# Patient Record
Sex: Female | Born: 1935 | Race: White | Hispanic: No
Health system: Southern US, Community
[De-identification: ages and names within clinical notes are randomized; demographics above are authoritative.]

---

## 2004-06-03 ENCOUNTER — Ambulatory Visit: Payer: Self-pay | Admitting: Internal Medicine

## 2005-05-15 ENCOUNTER — Ambulatory Visit: Payer: Self-pay | Admitting: Urology

## 2005-09-08 ENCOUNTER — Ambulatory Visit: Payer: Self-pay | Admitting: Internal Medicine

## 2006-02-23 ENCOUNTER — Ambulatory Visit: Payer: Self-pay | Admitting: Internal Medicine

## 2006-09-15 ENCOUNTER — Ambulatory Visit: Payer: Self-pay | Admitting: Internal Medicine

## 2006-10-07 ENCOUNTER — Ambulatory Visit: Payer: Self-pay | Admitting: Internal Medicine

## 2006-11-02 ENCOUNTER — Ambulatory Visit: Payer: Self-pay | Admitting: Internal Medicine

## 2006-11-07 ENCOUNTER — Ambulatory Visit: Payer: Self-pay | Admitting: Internal Medicine

## 2006-12-07 ENCOUNTER — Ambulatory Visit: Payer: Self-pay | Admitting: Internal Medicine

## 2007-01-07 ENCOUNTER — Ambulatory Visit: Payer: Self-pay | Admitting: Internal Medicine

## 2007-02-07 ENCOUNTER — Ambulatory Visit: Payer: Self-pay | Admitting: Internal Medicine

## 2007-02-08 ENCOUNTER — Ambulatory Visit: Payer: Self-pay | Admitting: Internal Medicine

## 2007-03-09 ENCOUNTER — Ambulatory Visit: Payer: Self-pay | Admitting: Internal Medicine

## 2007-08-16 ENCOUNTER — Ambulatory Visit: Payer: Self-pay | Admitting: Internal Medicine

## 2007-09-20 ENCOUNTER — Ambulatory Visit: Payer: Self-pay | Admitting: Internal Medicine

## 2007-10-20 ENCOUNTER — Emergency Department: Payer: Self-pay | Admitting: Emergency Medicine

## 2008-06-08 ENCOUNTER — Ambulatory Visit: Payer: Self-pay | Admitting: Oncology

## 2008-06-22 ENCOUNTER — Ambulatory Visit: Payer: Self-pay | Admitting: Internal Medicine

## 2008-07-05 ENCOUNTER — Ambulatory Visit: Payer: Self-pay | Admitting: Oncology

## 2008-07-09 ENCOUNTER — Ambulatory Visit: Payer: Self-pay | Admitting: Oncology

## 2008-07-12 ENCOUNTER — Ambulatory Visit: Payer: Self-pay | Admitting: Oncology

## 2009-02-22 ENCOUNTER — Ambulatory Visit: Payer: Self-pay | Admitting: Internal Medicine

## 2009-11-05 ENCOUNTER — Ambulatory Visit: Payer: Self-pay | Admitting: Internal Medicine

## 2009-11-06 ENCOUNTER — Ambulatory Visit: Payer: Self-pay | Admitting: Oncology

## 2009-11-08 IMAGING — US ABDOMEN ULTRASOUND
1 series · 14 of 25 positions shown · non-contrast
Comparison: none

REASON FOR EXAM: Follow-up splenomegaly, compare to previous abdominal
U/S
COMMENTS:

[Series 1: abdomen ultrasound · 0.24mm/px · 14 of 46 slices shown]
[im 1/46]
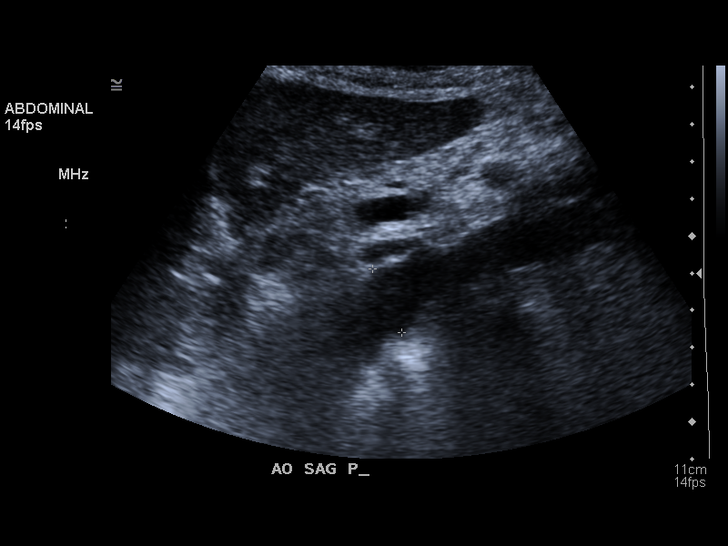
[im 4/46]
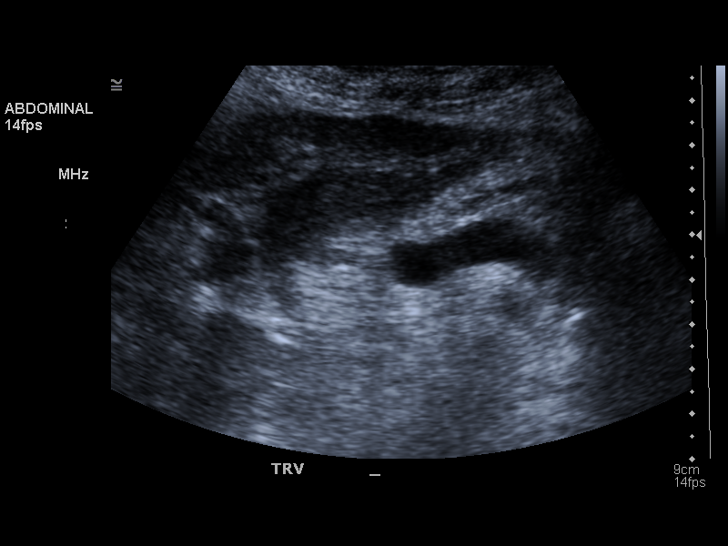
[im 8/46]
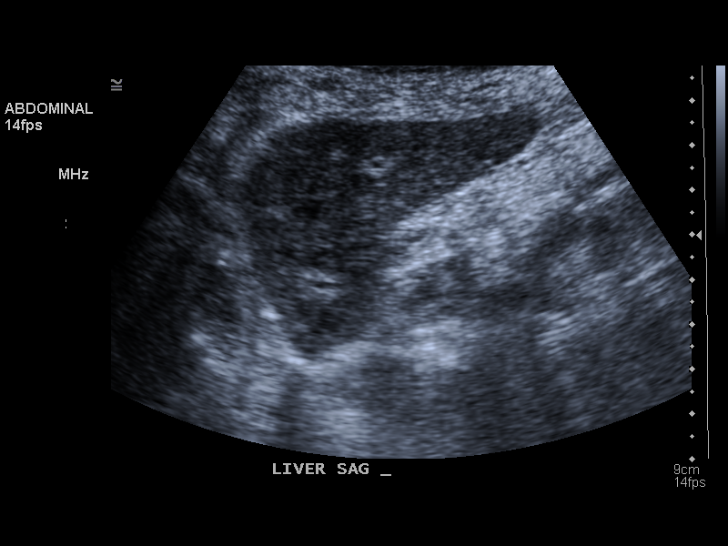
[im 12/46]
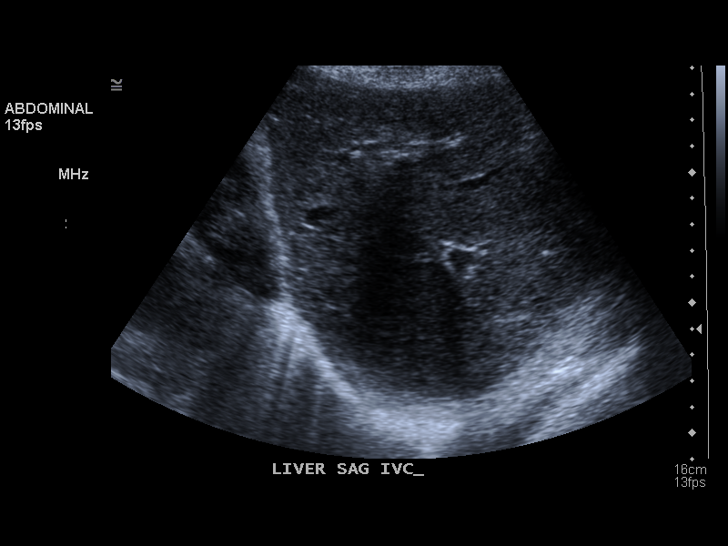
[im 16/46]
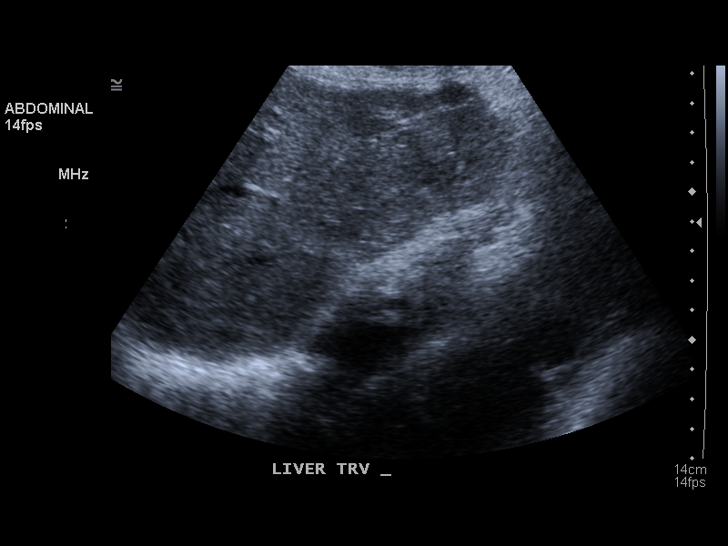
[im 17/46]
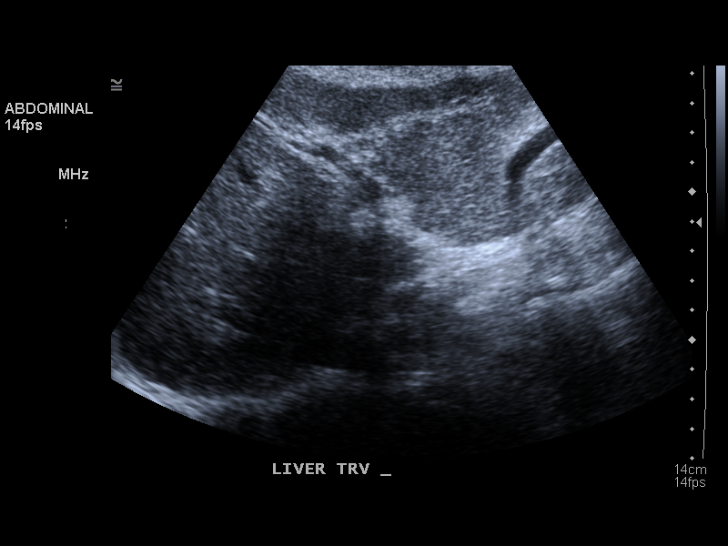
[im 21/46]
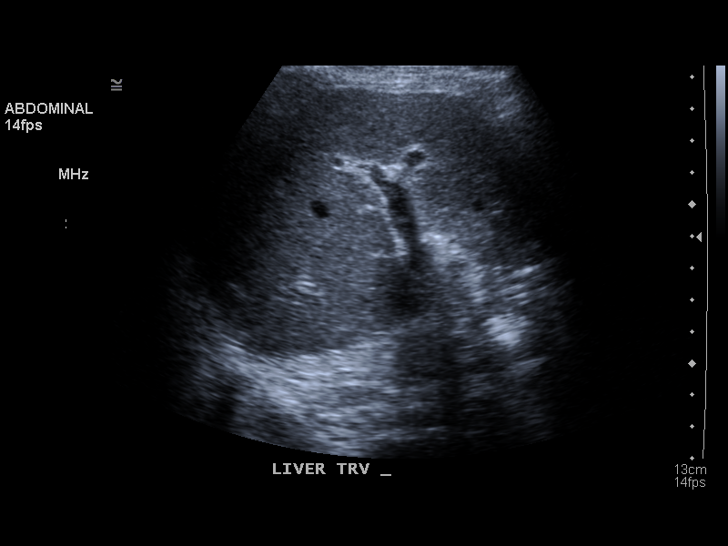
[im 25/46]
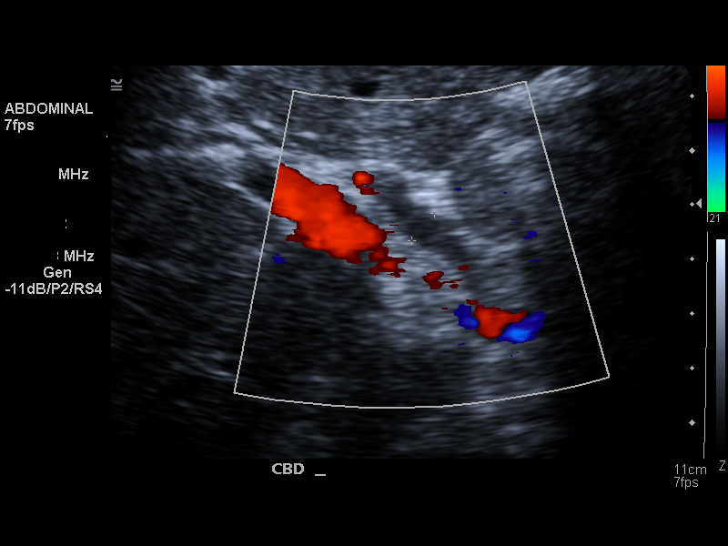
[im 29/46]
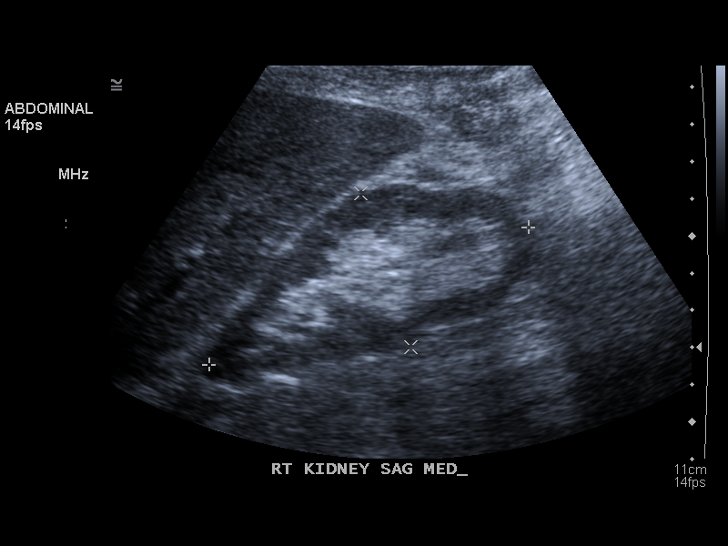
[im 31/46]
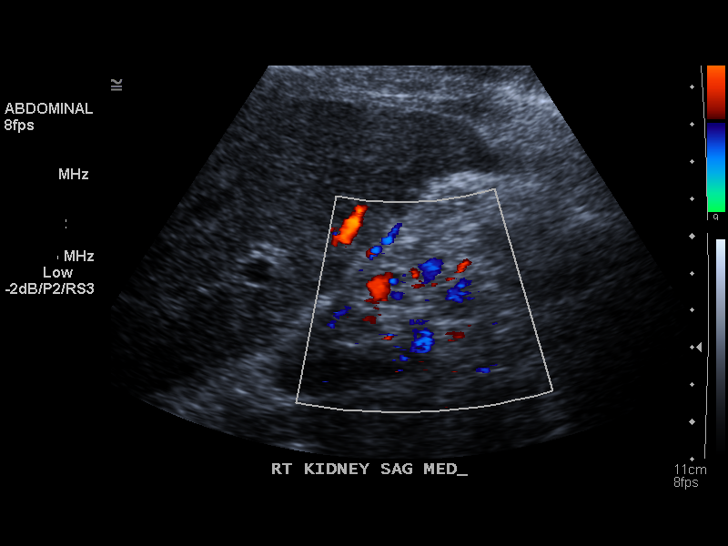
[im 34/46]
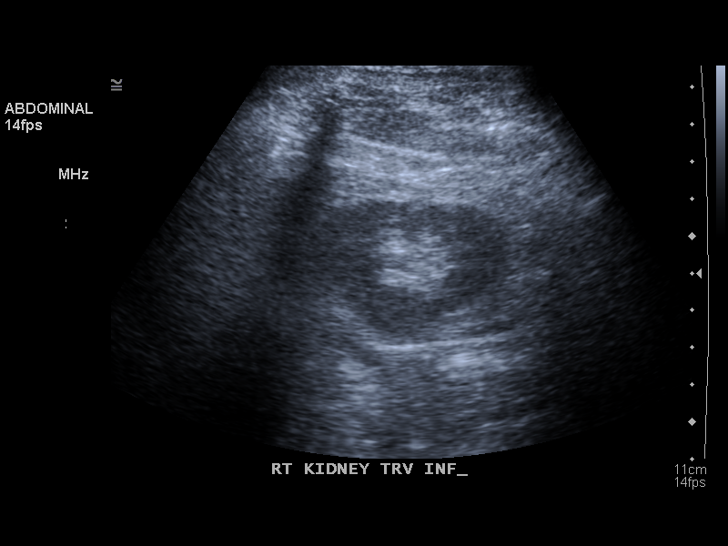
[im 38/46]
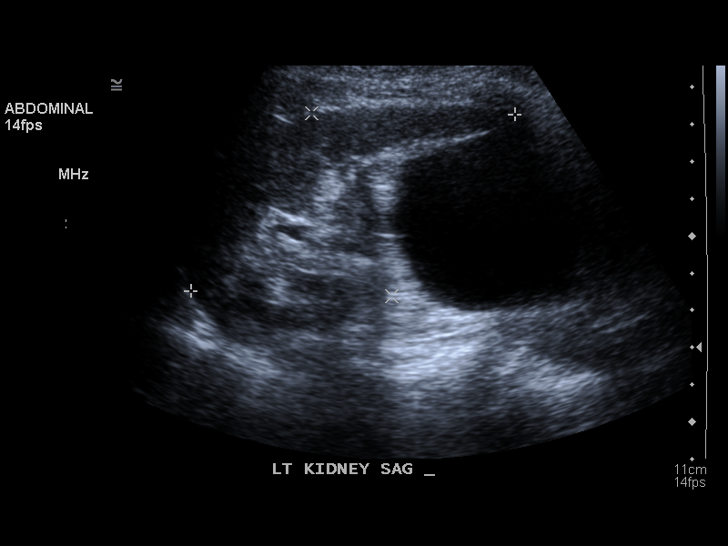
[im 42/46]
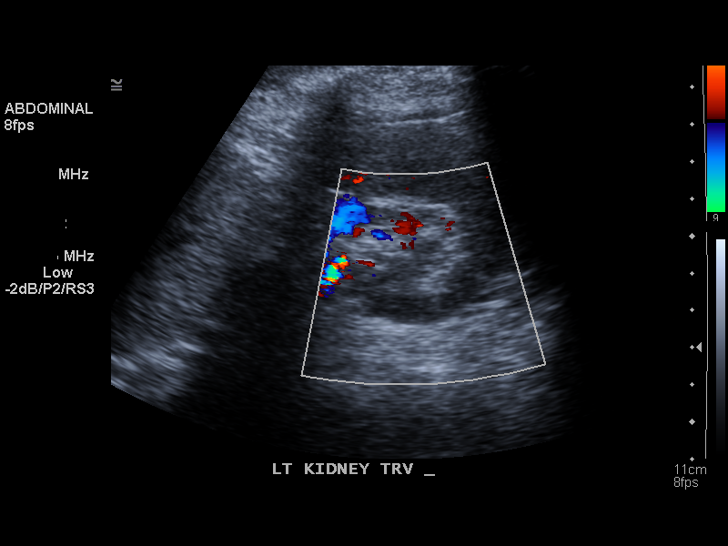
[im 46/46]
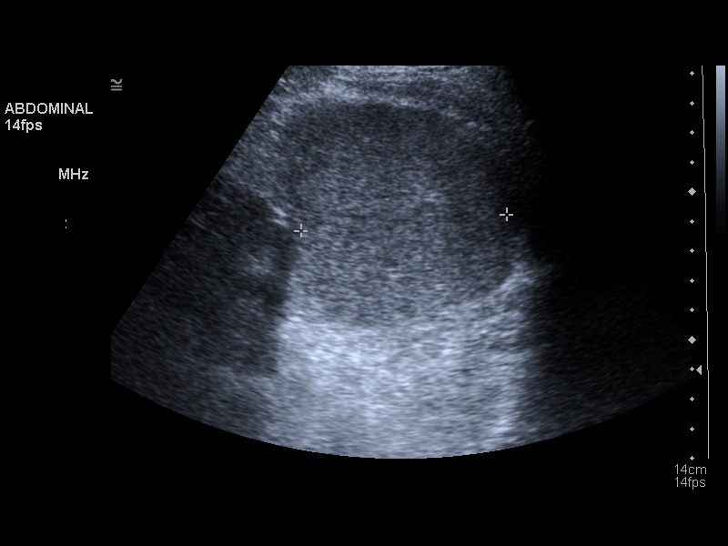

[14 of 25 positions shown; findings below may reference images not displayed]

PROCEDURE:     US  - US ABDOMEN GENERAL SURVEY  - August 16, 2007  [DATE]

RESULT:     The liver, abdominal aorta and inferior vena cava show no
significant abnormalities. The pancreas is not visualized adequately for
evaluation. The spleen is mildly enlarged but appears stable as compared to
the prior exam of 11/24/2006. The patient is status post cholecystectomy.
The common bile duct measures 6.5 mm in diameter which is within the limits
of normal for the post cholecystectomy patient. The kidneys show no
hydronephrosis. There is a 6.4 cm cyst of the inferior aspect of the LEFT
kidney. No ascites is seen.
IMPRESSION: 1.  Stable splenomegaly.
2.  The patient is status post cholecystectomy.
3.  A 6.4 cm cyst of the LEFT kidney is again noted.

## 2009-12-06 ENCOUNTER — Ambulatory Visit: Payer: Self-pay | Admitting: Oncology

## 2010-03-27 ENCOUNTER — Ambulatory Visit: Payer: Self-pay | Admitting: Internal Medicine

## 2010-12-12 ENCOUNTER — Other Ambulatory Visit: Payer: Self-pay | Admitting: Gastroenterology

## 2010-12-29 ENCOUNTER — Ambulatory Visit: Payer: Self-pay | Admitting: Gastroenterology

## 2011-01-04 ENCOUNTER — Emergency Department: Payer: Self-pay | Admitting: Internal Medicine

## 2011-01-06 ENCOUNTER — Ambulatory Visit: Payer: Self-pay | Admitting: Oncology

## 2011-01-07 ENCOUNTER — Ambulatory Visit: Payer: Self-pay | Admitting: Oncology

## 2011-02-07 ENCOUNTER — Ambulatory Visit: Payer: Self-pay | Admitting: Oncology

## 2011-03-09 ENCOUNTER — Ambulatory Visit: Payer: Self-pay | Admitting: Oncology

## 2011-04-09 ENCOUNTER — Ambulatory Visit: Payer: Self-pay | Admitting: Oncology

## 2011-05-09 ENCOUNTER — Ambulatory Visit: Payer: Self-pay | Admitting: Oncology

## 2011-06-09 ENCOUNTER — Ambulatory Visit: Payer: Self-pay | Admitting: Oncology

## 2011-06-25 LAB — CBC CANCER CENTER
Basophil %: 0.2 %
Eosinophil #: 0.1 x10 3/mm (ref 0.0–0.7)
Lymphocyte #: 0.8 x10 3/mm — ABNORMAL LOW (ref 1.0–3.6)
Lymphocyte %: 12.8 %
MCH: 28.8 pg (ref 26.0–34.0)
MCHC: 33.9 g/dL (ref 32.0–36.0)
MCV: 85 fL (ref 80–100)
Monocyte %: 6.8 %
Neutrophil #: 4.6 x10 3/mm (ref 1.4–6.5)
RBC: 4.56 10*6/uL (ref 3.80–5.20)
RDW: 16.2 % — ABNORMAL HIGH (ref 11.5–14.5)

## 2011-06-25 LAB — LACTATE DEHYDROGENASE: LDH: 209 U/L (ref 84–246)

## 2011-07-10 ENCOUNTER — Ambulatory Visit: Payer: Self-pay | Admitting: Oncology

## 2011-08-07 ENCOUNTER — Ambulatory Visit: Payer: Self-pay | Admitting: Oncology

## 2011-08-26 LAB — COMPREHENSIVE METABOLIC PANEL
Albumin: 4 g/dL (ref 3.4–5.0)
Alkaline Phosphatase: 99 U/L (ref 50–136)
Calcium, Total: 8.7 mg/dL (ref 8.5–10.1)
EGFR (African American): >60
EGFR (Non-African Amer.): >60
Glucose: 101 mg/dL — ABNORMAL HIGH (ref 65–99)
Potassium: 3.8 mmol/L (ref 3.5–5.1)
SGOT(AST): 26 U/L (ref 15–37)
SGPT (ALT): 23 U/L
Sodium: 141 mmol/L (ref 136–145)
Total Protein: 6.4 g/dL (ref 6.4–8.2)

## 2011-08-26 LAB — URINALYSIS, COMPLETE
Bilirubin,UR: NEGATIVE
Glucose,UR: NEGATIVE mg/dL (ref 0–75)
Leukocyte Esterase: NEGATIVE
Ph: 7 (ref 4.5–8.0)
RBC,UR: 15 /HPF (ref 0–5)
Specific Gravity: 1.009 (ref 1.003–1.030)
Squamous Epithelial: <1
WBC UR: <1 /HPF (ref 0–5)

## 2011-08-26 LAB — CBC
HCT: 40.1 % (ref 35.0–47.0)
MCV: 86 fL (ref 80–100)
Platelet: 113 10*3/uL — ABNORMAL LOW (ref 150–440)
RDW: 16.2 % — ABNORMAL HIGH (ref 11.5–14.5)
WBC: 8.7 10*3/uL (ref 3.6–11.0)

## 2011-08-27 ENCOUNTER — Observation Stay: Payer: Self-pay | Admitting: Surgery

## 2011-08-27 LAB — BASIC METABOLIC PANEL
Anion Gap: 7 (ref 7–16)
BUN: 6 mg/dL — ABNORMAL LOW (ref 7–18)
Co2: 28 mmol/L (ref 21–32)
Creatinine: 0.52 mg/dL — ABNORMAL LOW (ref 0.60–1.30)
EGFR (African American): >60
EGFR (Non-African Amer.): >60
Osmolality: 260 (ref 275–301)
Sodium: 129 mmol/L — ABNORMAL LOW (ref 136–145)

## 2011-08-27 LAB — CBC WITH DIFFERENTIAL/PLATELET
Basophil %: 0.5 %
Eosinophil %: 1.7 %
HCT: 39.7 % (ref 35.0–47.0)
Lymphocyte #: 0.7 10*3/uL — ABNORMAL LOW (ref 1.0–3.6)
MCHC: 33 g/dL (ref 32.0–36.0)
MCV: 84 fL (ref 80–100)
Monocyte #: 0.5 10*3/uL (ref 0.0–0.7)
Monocyte %: 4.9 %
Neutrophil #: 8.2 10*3/uL — ABNORMAL HIGH (ref 1.4–6.5)
RBC: 4.71 10*6/uL (ref 3.80–5.20)
WBC: 9.6 10*3/uL (ref 3.6–11.0)

## 2011-08-28 LAB — CBC WITH DIFFERENTIAL/PLATELET
Basophil #: 0 10*3/uL (ref 0.0–0.1)
Basophil %: 0.1 %
Eosinophil #: 0 10*3/uL (ref 0.0–0.7)
Eosinophil %: 0.3 %
HGB: 11.5 g/dL — ABNORMAL LOW (ref 12.0–16.0)
Lymphocyte %: 6.8 %
MCH: 28.5 pg (ref 26.0–34.0)
Monocyte #: 0.6 10*3/uL (ref 0.0–0.7)
Neutrophil %: 85.6 %
Platelet: 92 10*3/uL — ABNORMAL LOW (ref 150–440)
RBC: 4.02 10*6/uL (ref 3.80–5.20)
WBC: 8.8 10*3/uL (ref 3.6–11.0)

## 2011-08-28 LAB — BASIC METABOLIC PANEL
Anion Gap: 12 (ref 7–16)
BUN: 6 mg/dL — ABNORMAL LOW (ref 7–18)
Co2: 26 mmol/L (ref 21–32)
Creatinine: 0.5 mg/dL — ABNORMAL LOW (ref 0.60–1.30)
EGFR (African American): >60
Glucose: 97 mg/dL (ref 65–99)

## 2011-09-07 ENCOUNTER — Ambulatory Visit: Payer: Self-pay | Admitting: Oncology

## 2011-10-15 ENCOUNTER — Ambulatory Visit: Payer: Self-pay | Admitting: Oncology

## 2011-10-15 LAB — CBC CANCER CENTER
Basophil %: 0.2 %
Eosinophil %: 1.5 %
HGB: 12.6 g/dL (ref 12.0–16.0)
Lymphocyte #: 0.4 x10 3/mm — ABNORMAL LOW (ref 1.0–3.6)
MCH: 28.7 pg (ref 26.0–34.0)
Monocyte %: 8.1 %
Neutrophil #: 3.7 x10 3/mm (ref 1.4–6.5)
Neutrophil %: 81.1 %
Platelet: 72 x10 3/mm — ABNORMAL LOW (ref 150–440)
RDW: 16.5 % — ABNORMAL HIGH (ref 11.5–14.5)

## 2011-10-15 LAB — LACTATE DEHYDROGENASE: LDH: 200 U/L (ref 84–246)

## 2011-11-07 ENCOUNTER — Ambulatory Visit: Payer: Self-pay | Admitting: Oncology

## 2012-02-12 ENCOUNTER — Ambulatory Visit: Payer: Self-pay | Admitting: Oncology

## 2012-02-15 ENCOUNTER — Ambulatory Visit: Payer: Self-pay | Admitting: Oncology

## 2012-02-15 LAB — CBC CANCER CENTER
Basophil #: 0 x10 3/mm (ref 0.0–0.1)
Basophil %: 0.6 %
Eosinophil #: 0 x10 3/mm (ref 0.0–0.7)
HCT: 32.8 % — ABNORMAL LOW (ref 35.0–47.0)
HGB: 10.5 g/dL — ABNORMAL LOW (ref 12.0–16.0)
Lymphocyte #: 1 x10 3/mm (ref 1.0–3.6)
MCH: 25.3 pg — ABNORMAL LOW (ref 26.0–34.0)
MCHC: 32 g/dL (ref 32.0–36.0)
MCV: 79 fL — ABNORMAL LOW (ref 80–100)
Monocyte #: 0.3 x10 3/mm (ref 0.2–0.9)
Monocyte %: 8.3 %
Neutrophil #: 2.8 x10 3/mm (ref 1.4–6.5)
Platelet: 46 x10 3/mm — ABNORMAL LOW (ref 150–440)
RDW: 18.4 % — ABNORMAL HIGH (ref 11.5–14.5)
WBC: 4.2 x10 3/mm (ref 3.6–11.0)

## 2012-02-15 LAB — LACTATE DEHYDROGENASE: LDH: 227 U/L (ref 81–234)

## 2012-02-22 LAB — CBC CANCER CENTER
Basophil %: 0.5 %
Eosinophil #: 0 x10 3/mm (ref 0.0–0.7)
Eosinophil %: 0.8 %
HCT: 33.7 % — ABNORMAL LOW (ref 35.0–47.0)
HGB: 10.7 g/dL — ABNORMAL LOW (ref 12.0–16.0)
Lymphocyte %: 10.5 %
MCHC: 31.8 g/dL — ABNORMAL LOW (ref 32.0–36.0)
MCV: 79 fL — ABNORMAL LOW (ref 80–100)
Neutrophil #: 3.4 x10 3/mm (ref 1.4–6.5)
Neutrophil %: 80.2 %
RBC: 4.26 10*6/uL (ref 3.80–5.20)

## 2012-02-22 LAB — COMPREHENSIVE METABOLIC PANEL
Anion Gap: 12 (ref 7–16)
BUN: 11 mg/dL (ref 7–18)
Bilirubin,Total: 1.1 mg/dL — ABNORMAL HIGH (ref 0.2–1.0)
Chloride: 101 mmol/L (ref 98–107)
Co2: 24 mmol/L (ref 21–32)
Creatinine: 0.64 mg/dL (ref 0.60–1.30)
EGFR (African American): >60
EGFR (Non-African Amer.): >60
Glucose: 106 mg/dL — ABNORMAL HIGH (ref 65–99)
Potassium: 3.7 mmol/L (ref 3.5–5.1)
SGOT(AST): 13 U/L — ABNORMAL LOW (ref 15–37)
Total Protein: 5.4 g/dL — ABNORMAL LOW (ref 6.4–8.2)

## 2012-02-29 LAB — CBC CANCER CENTER
Basophil #: 0.1 x10 3/mm (ref 0.0–0.1)
Basophil %: 1 %
Eosinophil #: 0 x10 3/mm (ref 0.0–0.7)
HCT: 35.3 % (ref 35.0–47.0)
MCH: 25.5 pg — ABNORMAL LOW (ref 26.0–34.0)
MCV: 79 fL — ABNORMAL LOW (ref 80–100)
Monocyte #: 0.4 x10 3/mm (ref 0.2–0.9)
Monocyte %: 7.9 %
Neutrophil #: 4 x10 3/mm (ref 1.4–6.5)
Platelet: 44 x10 3/mm — ABNORMAL LOW (ref 150–440)
RBC: 4.45 10*6/uL (ref 3.80–5.20)
RDW: 19.1 % — ABNORMAL HIGH (ref 11.5–14.5)
WBC: 5 x10 3/mm (ref 3.6–11.0)

## 2012-02-29 LAB — COMPREHENSIVE METABOLIC PANEL
Anion Gap: 6 — ABNORMAL LOW (ref 7–16)
Calcium, Total: 8.9 mg/dL (ref 8.5–10.1)
Chloride: 95 mmol/L — ABNORMAL LOW (ref 98–107)
Creatinine: 0.71 mg/dL (ref 0.60–1.30)
EGFR (African American): >60
EGFR (Non-African Amer.): >60
Glucose: 99 mg/dL (ref 65–99)
Potassium: 3.6 mmol/L (ref 3.5–5.1)
SGOT(AST): 26 U/L (ref 15–37)
SGPT (ALT): 26 U/L (ref 12–78)
Total Protein: 6.1 g/dL — ABNORMAL LOW (ref 6.4–8.2)

## 2012-03-07 LAB — COMPREHENSIVE METABOLIC PANEL
Albumin: 3.6 g/dL (ref 3.4–5.0)
Anion Gap: 15 (ref 7–16)
BUN: 14 mg/dL (ref 7–18)
Chloride: 100 mmol/L (ref 98–107)
Co2: 23 mmol/L (ref 21–32)
EGFR (African American): >60
EGFR (Non-African Amer.): >60
Glucose: 132 mg/dL — ABNORMAL HIGH (ref 65–99)
Potassium: 3.5 mmol/L (ref 3.5–5.1)
SGOT(AST): 23 U/L (ref 15–37)
SGPT (ALT): 22 U/L (ref 12–78)
Total Protein: 5.8 g/dL — ABNORMAL LOW (ref 6.4–8.2)

## 2012-03-07 LAB — CBC CANCER CENTER
Basophil %: 0.7 %
Eosinophil %: 1.3 %
HGB: 11.5 g/dL — ABNORMAL LOW (ref 12.0–16.0)
Lymphocyte #: 0.6 x10 3/mm — ABNORMAL LOW (ref 1.0–3.6)
MCHC: 32.1 g/dL (ref 32.0–36.0)
MCV: 80 fL (ref 80–100)
Monocyte #: 0.3 x10 3/mm (ref 0.2–0.9)
Monocyte %: 6.5 %
Neutrophil #: 4.1 x10 3/mm (ref 1.4–6.5)
Neutrophil %: 80.4 %
Platelet: 44 x10 3/mm — ABNORMAL LOW (ref 150–440)
RBC: 4.47 10*6/uL (ref 3.80–5.20)
WBC: 5.2 x10 3/mm (ref 3.6–11.0)

## 2012-03-07 LAB — LACTATE DEHYDROGENASE: LDH: 173 U/L (ref 81–246)

## 2012-03-08 ENCOUNTER — Ambulatory Visit: Payer: Self-pay | Admitting: Oncology

## 2012-03-23 ENCOUNTER — Ambulatory Visit: Payer: Self-pay | Admitting: Internal Medicine

## 2012-04-04 ENCOUNTER — Emergency Department: Payer: Self-pay | Admitting: Emergency Medicine

## 2012-04-04 LAB — URINALYSIS, COMPLETE
Bilirubin,UR: NEGATIVE
Glucose,UR: NEGATIVE mg/dL (ref 0–75)
Nitrite: NEGATIVE
Ph: 6 (ref 4.5–8.0)
Protein: NEGATIVE
RBC,UR: 4 /HPF (ref 0–5)
Specific Gravity: 1.016 (ref 1.003–1.030)
Squamous Epithelial: NONE SEEN
WBC UR: 2 /HPF (ref 0–5)

## 2012-04-04 LAB — CBC
MCH: 27 pg (ref 26.0–34.0)
MCV: 80 fL (ref 80–100)
RDW: 19 % — ABNORMAL HIGH (ref 11.5–14.5)
WBC: 8.3 10*3/uL (ref 3.6–11.0)

## 2012-04-04 LAB — COMPREHENSIVE METABOLIC PANEL
Anion Gap: 11 (ref 7–16)
Bilirubin,Total: 1.6 mg/dL — ABNORMAL HIGH (ref 0.2–1.0)
Calcium, Total: 8.5 mg/dL (ref 8.5–10.1)
Chloride: 103 mmol/L (ref 98–107)
Co2: 25 mmol/L (ref 21–32)
EGFR (African American): >60
EGFR (Non-African Amer.): >60
Glucose: 123 mg/dL — ABNORMAL HIGH (ref 65–99)
Osmolality: 279 (ref 275–301)
SGOT(AST): 34 U/L (ref 15–37)
SGPT (ALT): 26 U/L (ref 12–78)
Sodium: 139 mmol/L (ref 136–145)
Total Protein: 6.2 g/dL — ABNORMAL LOW (ref 6.4–8.2)

## 2012-05-20 ENCOUNTER — Ambulatory Visit: Payer: Self-pay | Admitting: Oncology

## 2012-05-20 ENCOUNTER — Ambulatory Visit: Payer: Self-pay

## 2012-06-29 ENCOUNTER — Ambulatory Visit: Payer: Self-pay | Admitting: Oncology

## 2012-06-29 LAB — COMPREHENSIVE METABOLIC PANEL
Albumin: 2.8 g/dL — ABNORMAL LOW (ref 3.4–5.0)
Anion Gap: 5 — ABNORMAL LOW (ref 7–16)
BUN: 13 mg/dL (ref 7–18)
Bilirubin,Total: 0.4 mg/dL (ref 0.2–1.0)
Calcium, Total: 8.2 mg/dL — ABNORMAL LOW (ref 8.5–10.1)
EGFR (Non-African Amer.): >60
Glucose: 161 mg/dL — ABNORMAL HIGH (ref 65–99)
SGOT(AST): 25 U/L (ref 15–37)
Total Protein: 5.2 g/dL — ABNORMAL LOW (ref 6.4–8.2)

## 2012-06-29 LAB — CBC CANCER CENTER
Bands: 7 %
Eosinophil: 4 %
HCT: 33.4 % — ABNORMAL LOW (ref 35.0–47.0)
HGB: 11.2 g/dL — ABNORMAL LOW (ref 12.0–16.0)
MCH: 27.9 pg (ref 26.0–34.0)
MCHC: 33.6 g/dL (ref 32.0–36.0)
MCV: 83 fL (ref 80–100)
Metamyelocyte: 1 %
Monocytes: 6 %
Platelet: 90 x10 3/mm — ABNORMAL LOW (ref 150–440)
RBC: 4.03 10*6/uL (ref 3.80–5.20)
Segmented Neutrophils: 51 %
Variant Lymphocyte: 10 %

## 2012-07-09 ENCOUNTER — Ambulatory Visit: Payer: Self-pay | Admitting: Oncology

## 2012-09-06 DEATH — deceased

## 2013-03-23 IMAGING — CT CT ABD-PELV W/ CM
2 of 4 series · 12 of 32 positions shown, 18 images · IV contrast (isovue)
Comparison: 11/08/2009

REASON FOR EXAM: weight loss diarrhea lower abd pain
COMMENTS:

PROCEDURE:     KCT - KCT ABDOMEN/PELVIS W  - December 29, 2010  [DATE]
RESULT:     History: Weight loss, diarrhea
TECHNIQUE: Multiple axial images of the abdomen and pelvis were performed
from the lung bases to the pubic symphysis, with p.o. contrast and with 85
ml of Isovue 300 intravenous contrast.

[Series 4: abd with 5.0 i40f 3 · axial · 0.71mm/px · z∈[-413,-18]mm · 11 of 95 slices shown, 17 images]
[im 8/95  soft-tissue]
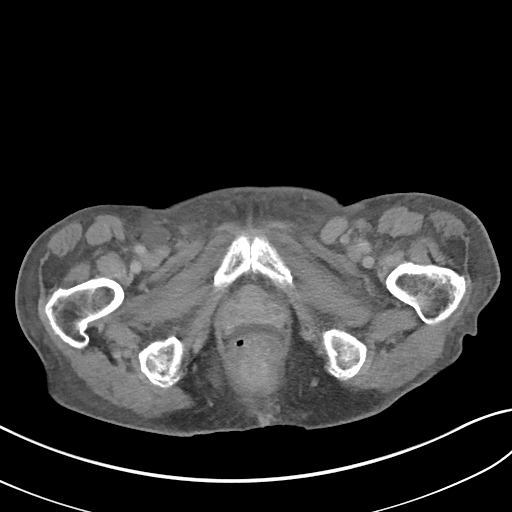
[im 8/95  bone]
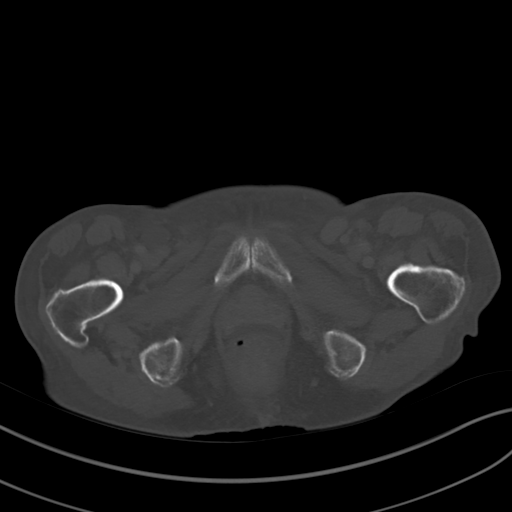
[im 16/95  soft-tissue]
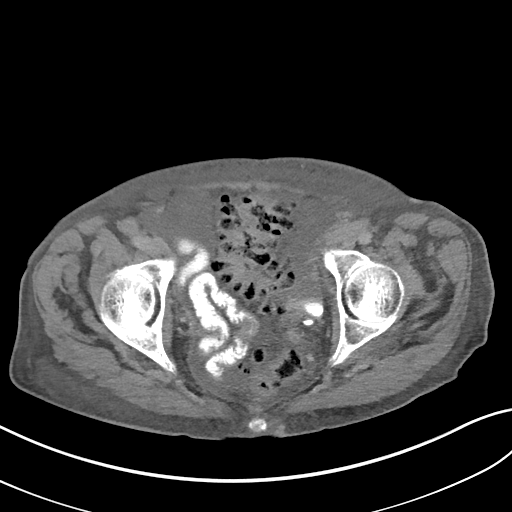
[im 24/95  soft-tissue]
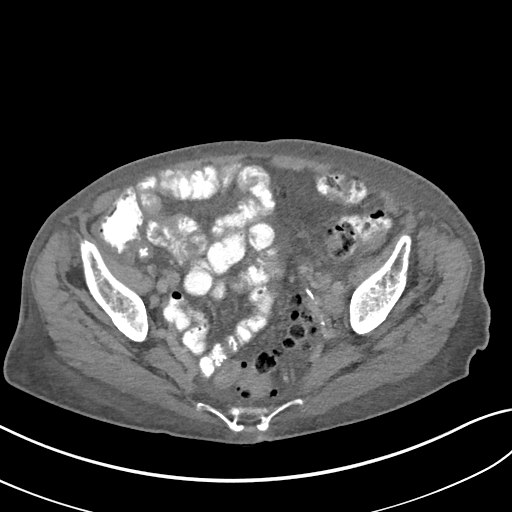
[im 32/95  soft-tissue]
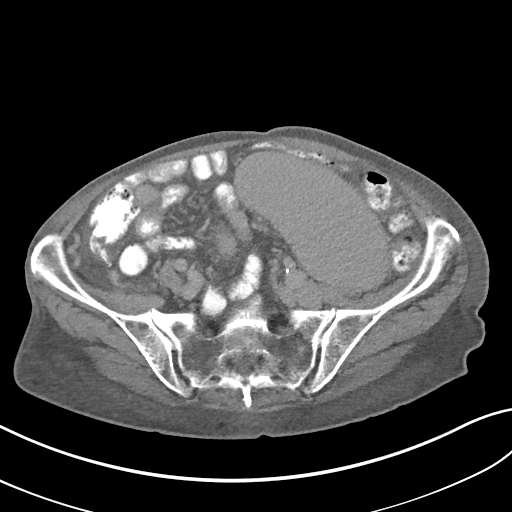
[im 40/95  soft-tissue]
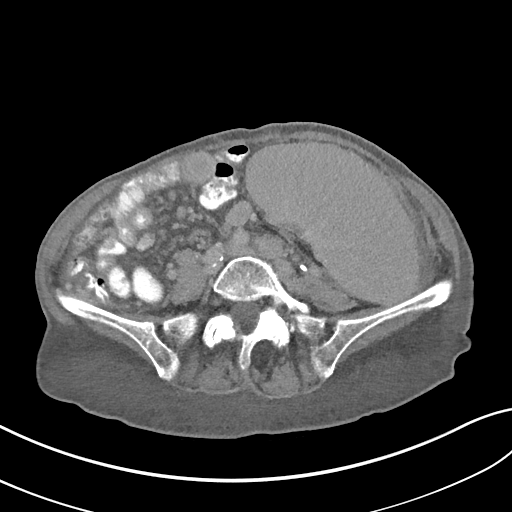
[im 48/95  soft-tissue]
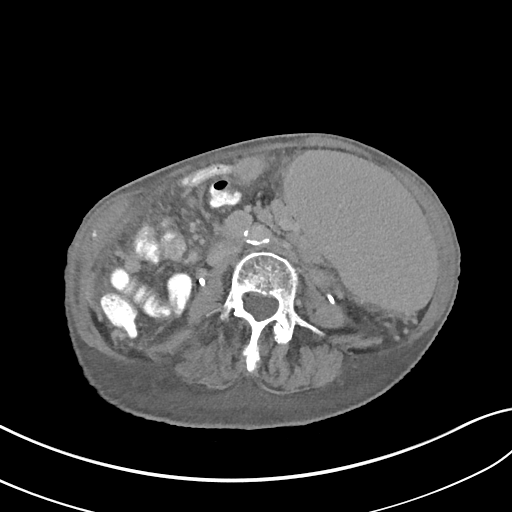
[im 55/95  soft-tissue]
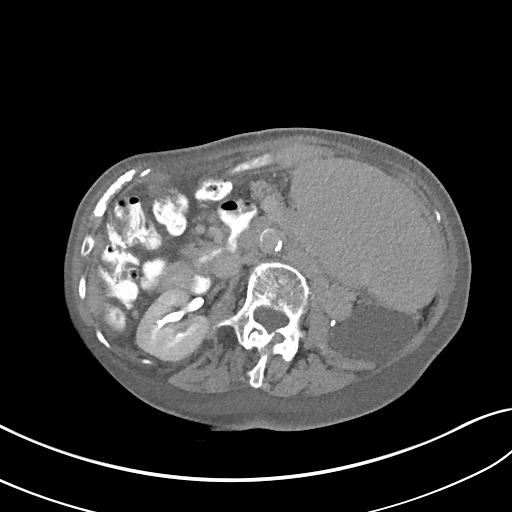
[im 63/95  soft-tissue]
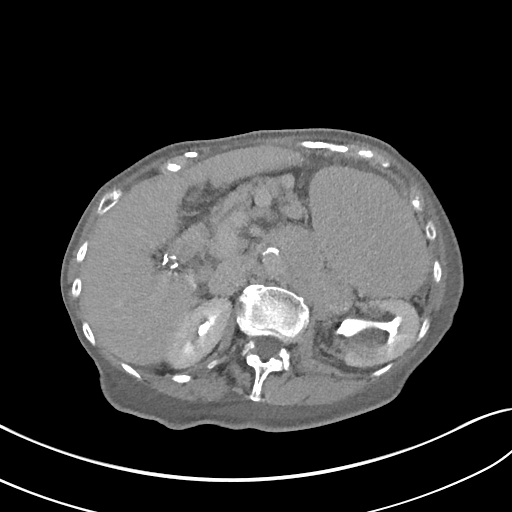
[im 63/95  lung]
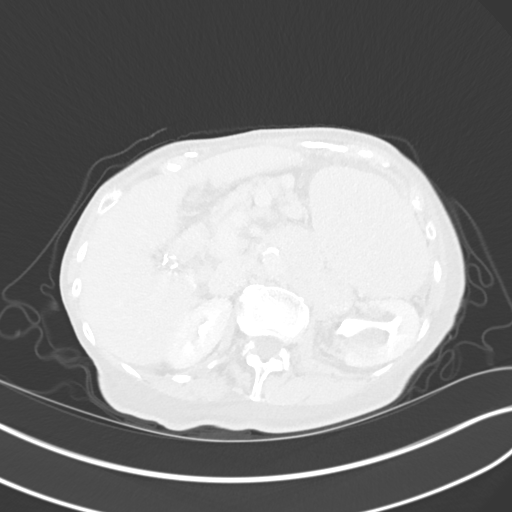
[im 71/95  soft-tissue]
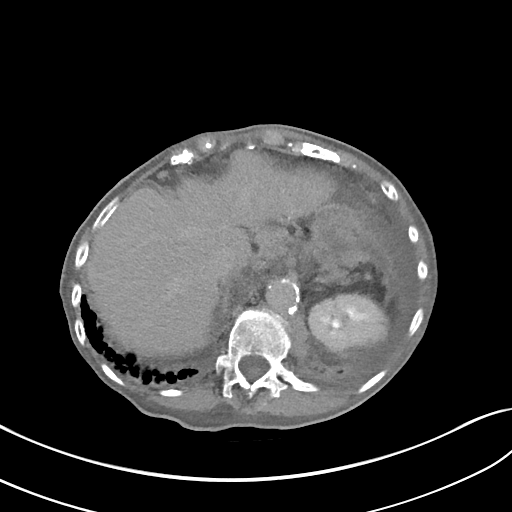
[im 71/95  lung]
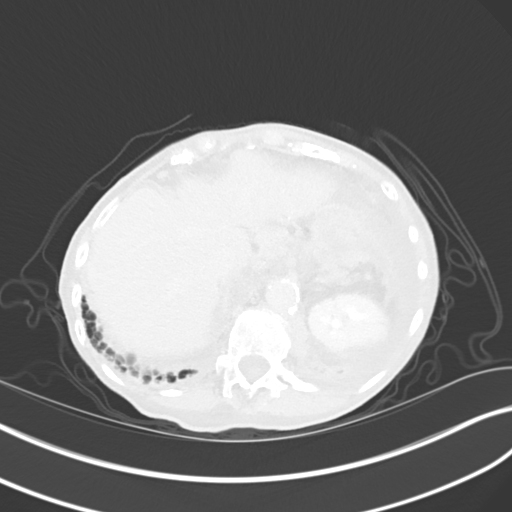
[im 71/95  bone]
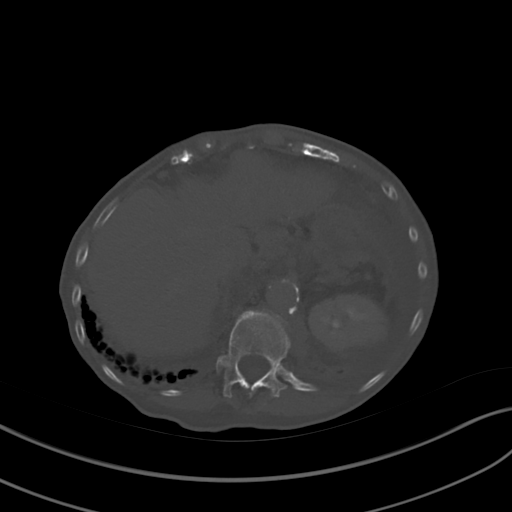
[im 79/95  soft-tissue]
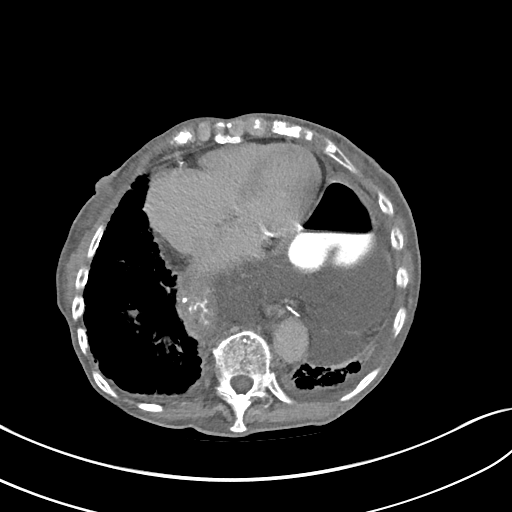
[im 79/95  lung]
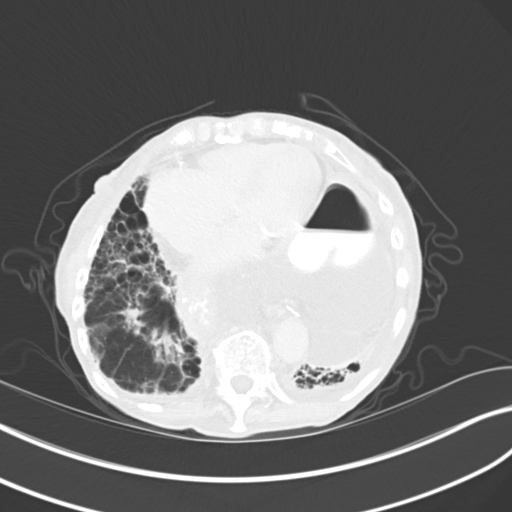
[im 87/95  soft-tissue]
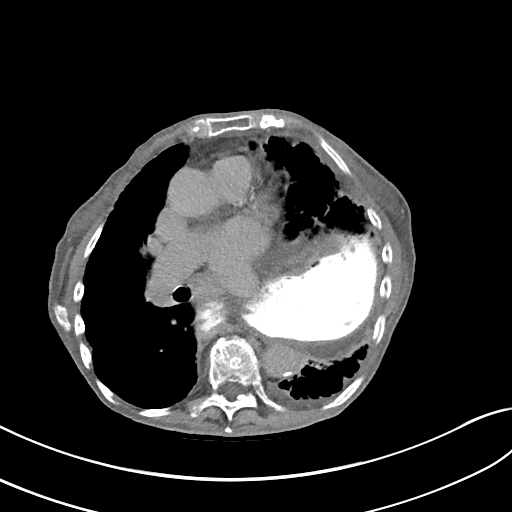
[im 87/95  lung]
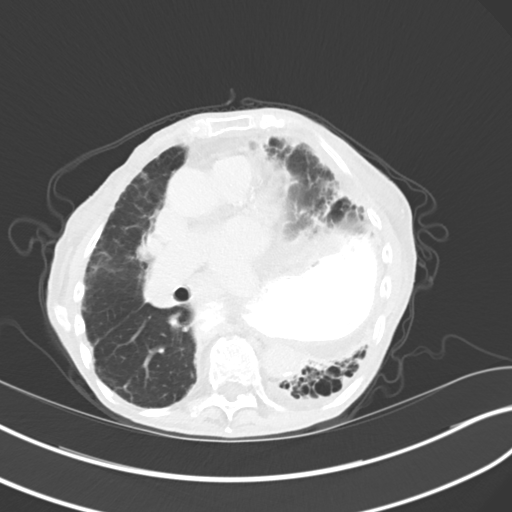

[Series 7: lung · axial · 0.71mm/px · 1 of 27 slices shown]
[im 9/27  bone]
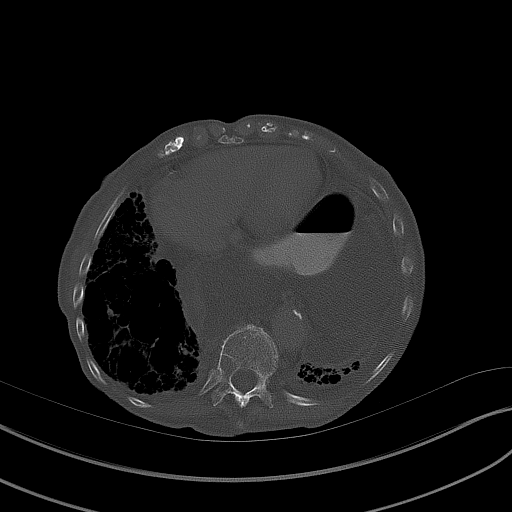

[12 of 32 positions shown; findings below may reference images not displayed]

FINDINGS: There is bilateral lower lobe pulmonary fibrosis. There is a small left
pleural effusion. There is coronary artery atherosclerosis.

The liver demonstrates no focal abnormality. There is no intrahepatic or
extrahepatic biliary ductal dilatation. The gallbladder is surgically
absent. The spleen is enlarged measuring 22.9 cm in length. There is a left
renal cyst. The right kidney, adrenal glands, and pancreas are normal. The
bladder is unremarkable.

The entirety of the stomach is within the thorax. There is evidence of prior
gastric surgery. There is distal esophageal wall thickening . There is fluid
surrounding the intrathoracic portion of the stomach. The duodenum, small
intestine, and large intestine demonstrate no contrast extravasation or
dilatation. There is diverticulosis without evidence of diverticulitis.
There is no pneumoperitoneum, pneumatosis, or portal venous gas. There is
left para-aortic retroperitoneal lymphadenopathy measuring approximately
x 7.3 cm. There is a small amount of pelvic free fluid.

The abdominal aorta is normal in caliber with atherosclerosis.

The osseous structures are unremarkable.
IMPRESSION: 1. Massive splenomegaly. There is retroperitoneal lymphadenopathy. The
appearance is concerning for lymphoma versus other neoplastic etiologies.
hematologic dyscrasias.

2. There is a large hila hernia with the entirety of the stomach within the
thorax with surrounding fluid. The abdominal and pelvic free fluid is new
compared with 11/08/2009.

## 2014-09-30 NOTE — Consult Note (Signed)
Brief Consult Note: Diagnosis: Hiatal Hernia. Known history of GERD.  History of Lymphoma followed by Dr. Gerarda Fractionimothy Finnegan.  Abdominal pain with associated nausea possible related to acute diverticulitis but do recognize not found on CT scan. Diffential also incluisve of recent treatment for UTI with finding of hematuria on U/A.  Possible renal calculi.   Orders entered.   Discussed with Attending MD.   Comments: Patient's presentation discussed with Dr. Lutricia FeilPaul Oh.  Recommendation for patient to continue with PPI therapy bid.  Patient should be discharged with PPI therapy, Aciphex 20 mg one tablet bid with directions of 30 minutes ac meals.  Continue antibiotic therapy but would consider changing as she states she has had a reaction with keflex in the past.  Will continue to monitor.  No further GI recommendations at this time.  Electronic Signatures: Rodman KeyHarrison, Dawn S (NP)  (Signed 22-Mar-13 18:08)  Authored: Brief Consult Note   Last Updated: 22-Mar-13 18:08 by Rodman KeyHarrison, Dawn S (NP)

## 2014-09-30 NOTE — Consult Note (Signed)
History of Present Illness:   Reason for Consult History of lymphoma, thrombocytopenia.    HPI   Patient admitted overnight to the observation unit with complaints of persistent lower abdominal pain for approximately one week.  She otherwise felt well.  She denies any fevers.  She has no urinary complaints.  She denies any changes in her bowel movements, melena, or hematochezia.  She states she has a good appetite.  She denies any neurologic complaints.  She has no chest pain or shortness of breath.   She offers no specific complaints today.  PFSH:   Additional Past Medical and Surgical History Past medical history: Rheumatoid arthritis, hypertension, osteoporosis, COPD, history of pericardial effusion.  Past surgical history: Partial gastrectomy, benign breast biopsy, partial hysterectomy.  Family history: Negative and noncontributory.  Daughter has multiple sclerosis.  Social history: Denies tobacco or alcohol use.   Review of Systems:   Review of Systems   As per HPI. Otherwise, 10 point system review was negative.status: ECoG 1.  NURSING NOTES: **Vital Signs.:   22-Mar-13 08:38    Temperature Temperature (F): 98    Celsius: 36.6    Temperature Source: oral    Pulse Pulse: 55    Pulse source: radial    Respirations Respirations: 16    Systolic BP Systolic BP: 573    Diastolic BP (mmHg) Diastolic BP (mmHg): 55    Mean BP: 73    Pulse Ox % Pulse Ox %: 99    Pulse Ox Activity Level: At rest    Oxygen Delivery: Room Air/ 21 %   Physical Exam:   Physical Exam General: Well-developed, well-nourished, no acute distress.  Eyes: Anicteric sclera. Neck: No lymphadenopathy. Lungs: Clear to auscultation bilaterally. Heart: Regular rate and rhythm. No rubs, murmurs, or gallops. Abdomen: Soft, improved splenomegaly, normoactive bowel sounds. Extremities: Scant edema. Neuro: Alert, answering all questions appropriately. Cranial nerves grossly intact. Skin: No rashes or  petechiae noted. Psych: Normal affect.    Levaquin: GI Distress  PCN: Unknown  Penicillin: Rash  Macrobid: Rash  Erythromycin: N/V/Diarrhea  Fosamax: N/V/Diarrhea  Codeine: N/V/Diarrhea  Sulfa drugs: Other  Evista: Other    Altace 10 mg oral capsule: 1 cap(s) orally once a day, Active, 0, None   aspirin 81 mg oral tablet: 1 tab(s) orally once a day, Active, 0, None   calcium carbonate-Vit D3 1263m/1000unit:   once a day, Active, 0, None   Vitamin B12 10088m SQ monthly:   , Active, 0, None   MVI daily:   , Active, 0, None   NTG 0.89m59mRN:   , Active, 0, None   cranberry + C 2000m53mily:   , Active, 0, None   Lunesta 3 mg oral tablet: 1 tab(s) orally once a day (at bedtime), Active, 0, None   Anusol HC-1:  rectal 2 times a day, Active, 0, None   diltiazem 120 mg/24 hours oral capsule, extended release: 1 cap(s) orally once a day, Active, 0, None   metoprolol succinate 50 mg oral tablet, extended release: 1 tab(s) orally once a day, Active, 0, None   doxycycline hyclate 100 mg oral tablet: 1 tab(s) orally 2 times a day, Active, 0, None   AcipHex 20 mg oral delayed release tablet: 1 tab(s) orally once a day, Active, 0, None  Routine Chem:  22-Mar-13 04:58    Glucose, Serum 97   BUN 6   Creatinine (comp) 0.50   Sodium, Serum 129   Potassium, Serum 4.0   Chloride,  Serum 91   CO2, Serum 26   Calcium (Total), Serum 8.1   Osmolality (calc) 256   eGFR (African American) >60   eGFR (Non-African American) >60   Anion Gap 12  Routine Hem:  22-Mar-13 04:58    WBC (CBC) 8.8   RBC (CBC) 4.02   Hemoglobin (CBC) 11.5   Hematocrit (CBC) 33.9   Platelet Count (CBC) 92   MCV 84   MCH 28.5   MCHC 33.9   RDW 15.7   Neutrophil % 85.6   Lymphocyte % 6.8   Monocyte % 7.2   Eosinophil % 0.3   Basophil % 0.1   Neutrophil # 7.5   Lymphocyte # 0.6   Monocyte # 0.6   Eosinophil # 0.0   Basophil # 0.0   Assessment and Plan:  Impression:   B-cell lymphoma,  splenomegaly, thrombocytopenia.  Plan:   1.  B-cell lymphoma: Patient completed 4 cycles of weekly Rituxan in September 2012 with significant improvement of her disease.  Patient still has residual lymphadenopathy, which by report is unchanged from January.  She also continues to have splenomegaly.  It is unlikely her new-onset pain is related to her lymphoma.  She has been instructed to keep her previously scheduled appointment in May of 2013 in the [facility] for repeat CT scan and further evaluation. Thrombocytopenia: Platelets continue to be low, but relatively stable at approximately at the patient's baseline.  No further intervention is required. consult, call with questions.  Electronic Signatures: Delight Hoh (MD)  (Signed [phone] 13:01)  Authored: HISTORY OF PRESENT ILLNESS, PFSH, ROS, NURSING NOTES, PE, ALLERGIES, HOME MEDICATIONS, LABS, ASSESSMENT AND PLAN   Last Updated: 22-Mar-13 13:01 by Delight Hoh (MD)

## 2014-09-30 NOTE — Consult Note (Signed)
Pt seen and examined. Feels better.  No nausea.  Less abd pain. See Dawn Harrison's notes. Recommend protonix bid qac. Continue Abxc. Advanced diet to full liquid diet. Pt wants to go home tonight. If patient tolerates advanced diet, ok for discharge from GI point of view. Make sure  patient f/u with Dr. Mechele CollinElliott later. Thanks.  Electronic Signatures: Lutricia Feilh, Simmie Garin (MD)  (Signed on 22-Mar-13 15:57)  Authored  Last Updated: 22-Mar-13 15:57 by Lutricia Feilh, Alysa Duca (MD)

## 2014-09-30 NOTE — H&P (Signed)
Subjective/Chief Complaint SP abd pain    History of Present Illness 20 hrs SP and RLQ abd pain, nausea, no emesis. nml BM yesterday. 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 Pain is not similar to that episode. Pain is improved in the ER now compared to earlier. Has been treated for UTI last 10 days with burning. no f/c.    Past History lymphoma HTN PSH br bx, GB, gastric resection for volvulus, hysterectomy    Past Medical Health Hypertension, Cancer   Past Med/Surgical Hx:  vertigo:   sinusitis:   thrombocytopenia:   osteoporosis:   interstitial lung disease:   osteoarthritis:   carpal tunnel:   hiatal hernia:   hypercholeserolemia:   reflux:   HTN:   varicose vein:   breast cyst aspiration:   femerol hernia repair:   hysterectomy:   cholecystectomy:   ALLERGIES:  Levaquin: GI Distress  PCN: Unknown  Penicillin: Rash  Macrobid: Rash  Erythromycin: N/V/Diarrhea  Fosamax: N/V/Diarrhea  Codeine: N/V/Diarrhea  Sulfa drugs: Other  Evista: Other  Family and Social History:   Family History Non-Contributory    Social History negative tobacco, negative ETOH    Place of Living Home   Review of Systems:   Fever/Chills No    Cough No    Abdominal Pain Yes  SP and RLQ    Diarrhea No    Constipation No    Nausea/Vomiting Yes    SOB/DOE No    Chest Pain No    Dysuria Yes    Tolerating Diet Nauseated    Medications/Allergies Reviewed Medications/Allergies reviewed   Physical Exam:   GEN NAD, comfortable    HEENT pink conjunctivae    NECK supple    RESP normal resp effort  clear BS    CARD regular rate    ABD positive tenderness  soft  midline and Kocher scar, no hernias. soft abd, min tendrness to deep palpation in RLQ and SP area. no guarding no rebound no perc tenderness    SKIN normal to palpation    PSYCH alert, A+O to time, place, person, good insight   Routine UA:  20-Mar-13 19:34    Color (UA) Straw    Clarity (UA) Clear   Glucose (UA) Negative   Bilirubin (UA) Negative   Ketones (UA) Negative   Specific Gravity (UA) 1.009   Blood (UA) 2+   pH (UA) 7.0   Protein (UA) Negative   Nitrite (UA) Negative   Leukocyte Esterase (UA) Negative   RBC (UA) 15 /HPF   WBC (UA) <1 /HPF   Epithelial Cells (UA) <1 /HPF  Routine Chem:  20-Mar-13 19:35    Glucose, Serum 101   BUN 9   Creatinine (comp) 0.57   Sodium, Serum 141   Potassium, Serum 3.8   Chloride, Serum 101   CO2, Serum 26   Calcium (Total), Serum 8.7  Hepatic:  20-Mar-13 19:35    Bilirubin, Total 0.9   Alkaline Phosphatase 99   SGPT (ALT) 23   SGOT (AST) 26   Total Protein, Serum 6.4   Albumin, Serum 4.0  Routine Chem:  20-Mar-13 19:35    Osmolality (calc) 280   eGFR (African American) >60   eGFR (Non-African American) >60   Anion Gap 14  Routine Hem:  20-Mar-13 19:35    WBC (CBC) 8.7   RBC (CBC) 4.65   Hemoglobin (CBC) 13.3   Hematocrit (CBC) 40.1   Platelet Count (CBC) 113   MCV 86  MCH 28.6   MCHC 33.2   RDW 16.2   Radiology Results: CT:    15-Jan-13 09:31, CT Chest, Abd, and Pelvis With Contrast   CT Chest, Abd, and Pelvis With Contrast   REASON FOR EXAM:    restaging lymphoma  COMMENTS:       PROCEDURE: KCT - KCT CHEST ABDOMEN AND PELVIS W  - Jun 23 2011  9:31AM     RESULT:     Technique: Helical 5 mm sections were obtained from the thoracic inlet   through the pubic symphysis statuspost intravenous administration of 85   mL Isovue 300 and oral contrast.    Findings: Evaluation of the mediastinum and hilar regions and structures   demonstrates no evidence of mediastinal or hilar adenopathy. A large   hiatal hernia primarily containing the stomach is identified. The lung   parenchyma demonstrates interstitial fibrotic changes within the bases.     No pulmonary nodules or masses are identified.    Multichamber cardiac enlargement is appreciated.    The liver and adrenals are unremarkable.  The pancreas is atrophic   particularly within the body and tail regions. The spleen has decreased   in size when compared to the previous study measuring 10.16 in maximal   longitudinal dimensions. A left renal cyst is once again identified and   unchanged. The kidneys are otherwise unremarkable. There is no CT   evidence of bowel obstruction nor secondary signs reflecting enteritis,   colitis, diverticulitis nor appendicitis. There is no evidence of an   abdominal aortic aneurysm. Diverticulosis is appreciated within the   sigmoid colon which is diffuse. This finding appears stable.    Enlarged paraaortic lymph nodes are identified on the left which when     compared to the previous study dated 04/01/2011 and 6/31/2011 appears   slightly less prominent with the largest area measuring 5.01 x 2.24 cm in   AP by transverse dimensions.    IMPRESSION:    1. Extensive diverticulosis within the sigmoid colon  2. Decreased size in the spleen.  3. Interstitial fibrosis moderate to severe within the lung bases.  4. Cardiomegaly.  5. Hiatal hernia.   6. Slightly less prominent paraaortic nodal mass when compared to   previous studies as described above.  7. Not mentioned above, there has been increased size of an aortocaval   lymphnode now measuring 1.75 cm in narrowest dimensions measured on     image #[id number].      Thank you for the opportunity to contribute to the care of your patient.           Verified By: Mikki Santee, M.D., MD     Assessment/Admission Diagnosis compared CT to prior studies, no obv change in stomach or HH. Doubt volvulus. pt mostly tender in SP and RLQ without peritoneal signs nml labs except minor hematuria rec admit, hydrate, continue abx for UTI reexamine. pt states that if surgery is ever indicated, wants to be transferred to [facility]. Offered transfer tonight but she and daughter choose to be admitted. discussed with Dr Owens Shark   Electronic  Signatures: Florene Glen (MD)  (Signed 21-Mar-13 04:15)  Authored: CHIEF COMPLAINT and HISTORY, PAST MEDICAL/SURGIAL HISTORY, ALLERGIES, FAMILY AND SOCIAL HISTORY, REVIEW OF SYSTEMS, PHYSICAL EXAM, LABS, Radiology, ASSESSMENT AND PLAN   Last Updated: 21-Mar-13 04:15 by Florene Glen (MD)

## 2014-09-30 NOTE — Consult Note (Signed)
PATIENT NAME:  Jeanette Kramer, WAGES MR#:  [id number] DATE OF BIRTH:  Apr 18, 1926  DATE OF CONSULTATION:  08/28/2011  REFERRING PHYSICIAN:  Dr. Excell Seltzer CONSULTING PHYSICIAN:  Lutricia Feil, MD/Dawn Mort Sawyers, NP  REASON FOR CONSULTATION: Hiatal hernia, abdominal pain with nausea.   HISTORY OF PRESENT ILLNESS: Ms. Jeanette Kramer is an 81 year old Caucasian female who presented to the hospital for the concern of bilateral lower quadrant abdominal pain with associated nausea. She states that the pain, nausea, as well as lumbar spinal pain all started at the same time. Progressively became worse.  Food did exacerbate it depending on what she ate. Movement made nausea worse.  She had a couple episodes of vomiting at home, bile-like emesis. No hematemesis. Bowels move on average 1 to 2 times a day. She has had mild constipation. It appears in speaking with her and her daughter that she has a long-standing history of irritable bowel syndrome, diarrhea predominant. It has been present since 2008 when she had surgery for gastric volvulus. Possibly her symptoms are also in relation to dumping syndrome. Consistency of stool has been mushy.  She does experience episodes of fecal incontinence at times.   The patient does take AcipHex 20 mg 1 tablet daily at home and will also take it twice a day depending on her symptoms. She is unable to elaborate how often she feels she needs a second dose. She has had failure with numerous PPI therapies in the past inclusive of Nexium, Protonix she was unable to tolerate, Prevacid, and Prilosec. The patient was treated for urinary tract infection 10 days prior to presenting to the hospital. She has significant medical history of lymphoma under the care of Dr. Orlie Dakin. She received four chemotherapeutic treatments in October. The patient is currently getting antibiotic therapy at this time. That and PPI therapy twice a day seem to be improving her symptoms. She does have concern though with the  realization that she is on Ancef as she states that she has had a reaction with Keflex in the past causing itching.   Most recent colonoscopy was done greater than 5 to 6 years ago by Dr. Mechele Collin. She does say she saw Dr. Lynnae Prude, who is her primary gastroenterologist, a month ago and underwent digital rectal examination and states that she was heme negative. She feels that she has never had an upper endoscopy performed in the past.     PAST MEDICAL/SURGICAL HISTORY:  1. Vertigo.  2. Sinusitis.  3. Thrombocytopenia.  4. Osteoporosis.  5. Interstitial lung disease 6. Osteoarthritis.  7. Carpal tunnel. 8. Large hiatal hernia.  9. Hypercholesteremia.  10. Reflux.  11. Hypertension.  12. Varicose veins.  13. Breast cyst aspiration. 14. Femoral hernia repair.  15. Hysterectomy.  16. Cholecystectomy. 17. Surgery for gastric volvulus.   FAMILY HISTORY: Noncontributory.   SOCIAL HISTORY: No tobacco. No alcohol. Resides at home.   ALLERGIES: Levaquin, penicillin, Macrobid, erythromycin, Fosamax, codeine, sulfa drugs, Evista, and cephalosporins.   HOME MEDICATIONS:  1. AcipHex 20 mg 1 tablet a day. 2. Altace 10 mg one a day. 3. Anusol HC suppository, one pr twice daily.  4. Aspirin 81 mg a day.  5. Calcium carbonate with vitamin D3 1200 mg/1000 units once a day.  6. Cranberry plus vitamin C 2000 mg a day. 7. Diltiazem 120 mg once orally daily.  8. Doxycycline 100 mg twice a day.  9. Lunesta 3 mg once a day. 10. Metoprolol succinate 50 mg extended release one a day.  11. Multivitamin  one a day. 12. Nitroglycerin 0.4 mg 1 tablet as directed as needed.  13. Vitamin B12 1000 mcg 1 subcutaneous once a month.   REVIEW OF SYSTEMS: All systems reviewed and checked and otherwise unremarkable other than as stated above.   PHYSICAL EXAMINATION:  VITAL SIGNS:  Temperature 98, pulse 55, respirations 16, blood pressure 111/55 with a pulse oximetry of 99%.   GENERAL: Well developed,  slender 81 year old Caucasian female, no acute distress noted. Daughter present during interviewing process who does state that her mother appears to be feeling better today.   HEENT: Normocephalic, atraumatic. Pupils equal, reactive to light. Conjunctivae clear. Sclerae anicteric.   NECK: Supple. Trachea midline. No lymphadenopathy or thyromegaly.   PULMONARY: Symmetric rise and fall of chest. Clear to auscultation throughout. Regular rate and rhythm, S1, S2.   ABDOMEN: Soft, nondistended. Bowel sounds in four quadrants. Mild discomfort bilaterally to lower quadrants of abdomen.   RECTAL: Deferred.   MUSCULOSKELETAL: Moving all four extremities. No contractures. No clubbing.   SKIN: Warm, dry. Color pink. No lesions. No rashes.   NEURO:  No gross neurological deficits noted.   PSYCH: Alert and oriented times four. Memory grossly intact.  LABORATORY, DIAGNOSTIC, AND RADIOLOGICAL DATA: Chemistry panel on admission: Glucose was 101. Creatinine 0.57, otherwise within normal limits. Today's date  BUN declined to 6, creatinine was 0.5, sodium 129, chloride 91. Calcium declined to 8.1. Hepatic panel was within normal limits on admission. CBC as well was within normal limits, except platelet count was 113,000, and RDW was 16.2. Today hemoglobin dropped from 13.3 to 11.5, hematocrit 40.1, declined to 33.9, platelet count 92,000. Urinalysis revealed +2 blood, negative for nitrites and leukocytes. CT of abdomen and pelvis with contrast revealed splenomegaly with extensive retroperitoneal adenopathy in the right greater than the left inguinal regions. It does not appear to be significantly changed from prior examination. Chronic interstitial lung disease and emphysematous lung disease. The patient is status post cholecystectomy. Stable left adrenal gland and degenerative bony changes and diverticulosis noted.   IMPRESSION: Known history of large hiatal hernia, history of acid reflux. Presentation for  abdominal pain bilaterally to lower quadrants of abdomen with associated nausea. Possible nausea in correlation with exacerbation of reflux as she does state that she will awaken at night with extensive water brash at times. Abdominal pain possibly in correlation with acute diverticulitis though not noted on CT impression. Possibly in correlation with resolving urinary tract infection. The patient underwent surgical evaluation and it is not felt that her symptoms are in correlation with gastric volvulus.   PLAN:  I spent several minutes discussing with the patient as well as her daughter the recommendation of the patient being discharged on PPI therapy twice a day. Do feel that will be tighter control of her acid reflux. Both are in agreement. Do recommend the patient to be discharged on AcipHex 20 mg, 1 tablet by mouth twice daily. She is recommended to take it prior to meals. The patient is to continue with antibiotic therapy, though do feel that given her history of reaction with Keflex in the past significant for pruritus, that current antibiotic therapy should be considered to be changed. We will continue to monitor the patient's status during her hospitalization. No further GI recommendations at this time.    These services provided by Owens Shark, NP in collaborative agreement with Dr. Lutricia Feil.  ____________________________ Rodman Key, NP dsh:bjt D: 08/28/2011 18:06:35 ET T: 08/29/2011 12:32:47 ET JOB#: [id number]  cc: Burnadette Pop.  Romeo AppleHarrison, NP, <Dictator> Rodman KeyAWN S HARRISON MD ELECTRONICALLY SIGNED 09/02/2011 16:33

## 2014-09-30 NOTE — H&P (Signed)
PATIENT NAME:  Jeanette BologneseWALSTON, Charlee MR#:  [id number] DATE OF BIRTH:  [birth year]  DATE OF ADMISSION:  08/27/2011  CHIEF COMPLAINT: Abdominal pain.   HISTORY OF PRESENT ILLNESS: This is a patient with approximately 20 hours of suprapubic and right lower quadrant abdominal pain. She has had some nausea but no emesis. Of significance is that the patient has had a prior gastric resection for necrotic stomach with gastric volvulus, this having been [facility]. She states that the pain is not similar to that episode and that the pain is better now being in the ER than what it was when it started earlier in the day. She has also been treated for urinary tract infection over the last 10 days and is currently taking antibiotics for that but denies fevers or chills.   PAST MEDICAL HISTORY: 1. Gastric volvulus with necrosis. 2. Lymphoma, currently being treated. 3. Hypertension.  4. Thrombocytopenia.  5. Osteoporosis.  6. Long-standing hiatal hernia.   PAST SURGICAL HISTORY:  1. Left breast biopsy. 2. Cholecystectomy. 3. Gastric resection for necrotic stomach with volvulus.  4. Hysterectomy.   ALLERGIES: Multiple, see chart.   MEDICATIONS: Multiple, see chart.   FAMILY HISTORY: Noncontributory.   SOCIAL HISTORY: Patient does not smoke or drink. She is accompanied by her daughter who works in a [employer] park manager[employer] office.   REVIEW OF SYSTEMS: 10 system review is performed and negative with the exception of that mentioned in the history of present illness.   PHYSICAL EXAMINATION:  GENERAL: Comfortable-appearing Caucasian female patient.   VITAL SIGNS: Afebrile, pulse 80, respirations 17, blood pressure 159/86, 96% room air sat. Pain scale of 1.   HEENT: No scleral icterus.   NECK: No palpable neck nodes.   CHEST: Clear to auscultation.   CARDIAC: Regular rate and rhythm.   ABDOMEN: Soft, minimally tender in the right lower quadrant and suprapubic area. No upper abdominal tenderness.  No guarding. No rebound. No percussion tenderness and no other peritoneal signs. Scars are noted without hernia.   EXTREMITIES: Without edema. Calves are nontender.   NEUROLOGIC: Grossly intact.   INTEGUMENT: No jaundice.   LABORATORY, DIAGNOSTIC AND RADIOLOGICAL DATA: Urinalysis shows 2+ blood but negative leukocyte esterase. Creatinine 0.57, serum CO2 26, white blood cell count 8.7, hemoglobin and hematocrit 13.3 and 40.1 with a platelet count 113. CT scan is personally reviewed. There is a very large hiatal hernia with most of the chest in the stomach. No obvious volvulus. Contrast has moved into the small bowel.   ASSESSMENT AND PLAN: This is a patient with a history of gastric volvulus. I was called for a potential volvulus although the patient's symptoms are consistent with that nor is the CT findings or physical examination but even with all of her normal labs, I would like to admit the patient to in the hospital and hydrate her, try to control her pain and nausea and determine the need for any further work-up once she is in the hospital under observation. Patient and family were in agreement with this. [facility] which [facility] stated that if surgery was necessary [facility] would [facility].  ____________________________ Adah Salvageichard E. Excell Seltzerooper, MD rec:cms D: 08/27/2011 21:50:53 ET T: 08/28/2011 06:52:39 ET JOB#: [id number]  cc: Adah Salvageichard E. Excell Seltzerooper, MD, <Dictator> Lattie HawICHARD E Saory Carriero MD ELECTRONICALLY SIGNED 09/04/2011 7:37
